# Patient Record
Sex: Male | Born: 1995 | Race: Black or African American | Hispanic: No | Marital: Single | Smoking: Current every day smoker
Health system: Southern US, Community
[De-identification: ages and names within clinical notes are randomized; demographics above are authoritative.]

## PROBLEM LIST (undated history)

## (undated) HISTORY — PX: TONSILLECTOMY AND ADENOIDECTOMY: SUR1326

---
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
## 2015-09-18 ENCOUNTER — Emergency Department: Payer: Self-pay

## 2015-09-18 ENCOUNTER — Encounter: Payer: Self-pay | Admitting: Emergency Medicine

## 2015-09-18 ENCOUNTER — Emergency Department
Admission: EM | Admit: 2015-09-18 | Discharge: 2015-09-18 | Disposition: A | Discharge status: Home / Self Care | Payer: Self-pay | Attending: Emergency Medicine | Admitting: Emergency Medicine

## 2015-09-18 DIAGNOSIS — F17210 Nicotine dependence, cigarettes, uncomplicated: Secondary | ICD-10-CM | POA: Insufficient documentation

## 2015-09-18 DIAGNOSIS — Y999 Unspecified external cause status: Secondary | ICD-10-CM | POA: Insufficient documentation

## 2015-09-18 DIAGNOSIS — S71102A Unspecified open wound, left thigh, initial encounter: Secondary | ICD-10-CM | POA: Insufficient documentation

## 2015-09-18 DIAGNOSIS — Y92810 Car as the place of occurrence of the external cause: Secondary | ICD-10-CM | POA: Insufficient documentation

## 2015-09-18 DIAGNOSIS — W3400XA Accidental discharge from unspecified firearms or gun, initial encounter: Secondary | ICD-10-CM

## 2015-09-18 DIAGNOSIS — X58XXXA Exposure to other specified factors, initial encounter: Secondary | ICD-10-CM | POA: Insufficient documentation

## 2015-09-18 DIAGNOSIS — Y939 Activity, unspecified: Secondary | ICD-10-CM | POA: Insufficient documentation

## 2015-09-18 LAB — CBC
HCT: 38.8 % — ABNORMAL LOW (ref 40.0–52.0)
HEMOGLOBIN: 13.1 g/dL (ref 13.0–18.0)
MCH: 30.5 pg (ref 26.0–34.0)
MCHC: 33.6 g/dL (ref 32.0–36.0)
MCV: 90.6 fL (ref 80.0–100.0)
Platelets: 186 10*3/uL (ref 150–440)
RBC: 4.29 MIL/uL — AB (ref 4.40–5.90)
RDW: 13.5 % (ref 11.5–14.5)
WBC: 11.6 10*3/uL — ABNORMAL HIGH (ref 3.8–10.6)

## 2015-09-18 LAB — COMPREHENSIVE METABOLIC PANEL
ALBUMIN: 5.3 g/dL — AB (ref 3.5–5.0)
ALK PHOS: 70 U/L (ref 38–126)
ALT: 12 U/L — AB (ref 17–63)
AST: 35 U/L (ref 15–41)
Anion gap: 11 (ref 5–15)
BILIRUBIN TOTAL: 0.8 mg/dL (ref 0.3–1.2)
BUN: 9 mg/dL (ref 6–20)
CO2: 26 mmol/L (ref 22–32)
CREATININE: 1.01 mg/dL (ref 0.61–1.24)
Calcium: 9.1 mg/dL (ref 8.9–10.3)
Chloride: 99 mmol/L — ABNORMAL LOW (ref 101–111)
GFR calc Af Amer: >60 mL/min (ref >60)
GLUCOSE: 119 mg/dL — AB (ref 65–99)
POTASSIUM: 2.6 mmol/L — AB (ref 3.5–5.1)
Sodium: 136 mmol/L (ref 135–145)
TOTAL PROTEIN: 8 g/dL (ref 6.5–8.1)

## 2015-09-18 LAB — TYPE AND SCREEN
ABO/RH(D): O POS
Antibody Screen: NEGATIVE

## 2015-09-18 LAB — ABO/RH: ABO/RH(D): O POS

## 2015-09-18 MED ORDER — ONDANSETRON HCL 4 MG/2ML IJ SOLN
4.0000 mg | Freq: Once | INTRAMUSCULAR | Status: AC
  Administered 2015-09-18: 4 mg via INTRAVENOUS

## 2015-09-18 MED ORDER — MORPHINE SULFATE (PF) 4 MG/ML IV SOLN
6.0000 mg | Freq: Once | INTRAVENOUS | Status: AC
  Administered 2015-09-18: 6 mg via INTRAVENOUS

## 2015-09-18 MED ORDER — SODIUM CHLORIDE 0.9 % IV BOLUS (SEPSIS)
1000.0000 mL | Freq: Once | INTRAVENOUS | Status: AC
Start: 2015-09-18 — End: 2015-09-18
  Administered 2015-09-18: 1000 mL via INTRAVENOUS

## 2015-09-18 MED ORDER — HYDROCODONE-ACETAMINOPHEN 5-325 MG PO TABS: 1.0000 | ORAL_TABLET | ORAL | Status: DC | PRN

## 2015-09-18 MED ORDER — IOPAMIDOL (ISOVUE-370) INJECTION 76%
125.0000 mL | Freq: Once | INTRAVENOUS | Status: AC | PRN
  Administered 2015-09-18: 125 mL via INTRAVENOUS

## 2015-09-18 MED ORDER — CEPHALEXIN 500 MG PO CAPS: 500.0000 mg | ORAL_CAPSULE | Freq: Two times a day (BID) | ORAL | Status: AC

## 2015-09-18 MED ORDER — NAPROXEN 500 MG PO TABS: 500.0000 mg | ORAL_TABLET | Freq: Two times a day (BID) | ORAL | Status: AC

## 2015-09-18 MED ORDER — CEFAZOLIN SODIUM 1-5 GM-% IV SOLN
INTRAVENOUS | Status: AC
  Filled 2015-09-18: qty 50

## 2015-09-18 MED ORDER — CEFAZOLIN SODIUM 1-5 GM-% IV SOLN
1.0000 g | Freq: Once | INTRAVENOUS | Status: AC
  Administered 2015-09-18: 1 g via INTRAVENOUS
  Filled 2015-09-18: qty 50
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
## 2015-09-18 NOTE — ED Notes (Signed)
Dr. Cyril LoosenKinner informed of critical potassium of 2.6

## 2015-09-24 ENCOUNTER — Telehealth: Payer: Self-pay | Admitting: Surgery

## 2015-09-24 NOTE — Telephone Encounter (Signed)
Received call from patient's mother. I moved patient's appointment up per her request.

## 2015-09-24 NOTE — Telephone Encounter (Signed)
Patients mother called and would like for Garritt to be seen before May 25th. He is in pain and out of one of the pain medications. She didn't know the name of it but said she would have it by the phone when the nurse calls her back. He is up walking around but the swelling is still present. No fever that she knows of.

## 2015-09-24 NOTE — Telephone Encounter (Signed)
Returned phone call at this time. No answer. Left voicemail for return phone call.

## 2015-09-29 ENCOUNTER — Encounter: Payer: Self-pay | Admitting: Surgery

## 2015-09-29 ENCOUNTER — Ambulatory Visit (INDEPENDENT_AMBULATORY_CARE_PROVIDER_SITE_OTHER): Payer: Self-pay | Admitting: Surgery

## 2015-09-29 ENCOUNTER — Encounter: Payer: Self-pay | Admitting: Emergency Medicine

## 2015-09-29 VITALS — BP 127/72 | HR 68 | Temp 98.1°F | Ht 68.0 in | Wt 149.0 lb

## 2015-09-29 DIAGNOSIS — Y929 Unspecified place or not applicable: Secondary | ICD-10-CM | POA: Insufficient documentation

## 2015-09-29 DIAGNOSIS — Y999 Unspecified external cause status: Secondary | ICD-10-CM | POA: Insufficient documentation

## 2015-09-29 DIAGNOSIS — Y939 Activity, unspecified: Secondary | ICD-10-CM | POA: Insufficient documentation

## 2015-09-29 DIAGNOSIS — W3400XA Accidental discharge from unspecified firearms or gun, initial encounter: Secondary | ICD-10-CM

## 2015-09-29 DIAGNOSIS — S71102A Unspecified open wound, left thigh, initial encounter: Secondary | ICD-10-CM | POA: Insufficient documentation

## 2015-09-29 DIAGNOSIS — F17210 Nicotine dependence, cigarettes, uncomplicated: Secondary | ICD-10-CM | POA: Insufficient documentation

## 2015-09-29 DIAGNOSIS — W3400XD Accidental discharge from unspecified firearms or gun, subsequent encounter: Secondary | ICD-10-CM | POA: Insufficient documentation

## 2015-09-29 DIAGNOSIS — X58XXXA Exposure to other specified factors, initial encounter: Secondary | ICD-10-CM | POA: Insufficient documentation

## 2015-09-29 DIAGNOSIS — F1290 Cannabis use, unspecified, uncomplicated: Secondary | ICD-10-CM | POA: Insufficient documentation

## 2015-09-29 LAB — CBC
HEMATOCRIT: 37.6 % — AB (ref 40.0–52.0)
HEMOGLOBIN: 12.5 g/dL — AB (ref 13.0–18.0)
MCH: 30.2 pg (ref 26.0–34.0)
MCHC: 33.4 g/dL (ref 32.0–36.0)
MCV: 90.5 fL (ref 80.0–100.0)
Platelets: 211 10*3/uL (ref 150–440)
RBC: 4.15 MIL/uL — ABNORMAL LOW (ref 4.40–5.90)
RDW: 13.3 % (ref 11.5–14.5)
WBC: 11.9 10*3/uL — AB (ref 3.8–10.6)
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
## 2015-09-30 ENCOUNTER — Ambulatory Visit: Payer: Self-pay | Admitting: Surgery

## 2015-09-30 ENCOUNTER — Emergency Department
Admission: EM | Admit: 2015-09-30 | Discharge: 2015-09-30 | Disposition: A | Discharge status: Home / Self Care | Payer: Self-pay | Attending: Emergency Medicine | Admitting: Emergency Medicine

## 2015-09-30 DIAGNOSIS — T148XXA Other injury of unspecified body region, initial encounter: Secondary | ICD-10-CM

## 2015-09-30 DIAGNOSIS — W3400XA Accidental discharge from unspecified firearms or gun, initial encounter: Secondary | ICD-10-CM

## 2015-09-30 DIAGNOSIS — L089 Local infection of the skin and subcutaneous tissue, unspecified: Secondary | ICD-10-CM | POA: Insufficient documentation

## 2015-09-30 MED ORDER — PENTAFLUOROPROP-TETRAFLUOROETH EX AERO
INHALATION_SPRAY | CUTANEOUS | Status: AC
  Administered 2015-09-30: 30
  Filled 2015-09-30: qty 30

## 2015-09-30 MED ORDER — SULFAMETHOXAZOLE-TRIMETHOPRIM 800-160 MG PO TABS
1.0000 | ORAL_TABLET | Freq: Two times a day (BID) | ORAL | Status: AC
Start: 2015-09-30 — End: 2015-10-06

## 2015-09-30 MED ORDER — LIDOCAINE-EPINEPHRINE (PF) 1 %-1:200000 IJ SOLN
INTRAMUSCULAR | Status: AC
  Administered 2015-09-30: 30 mL
  Filled 2015-09-30: qty 30

## 2015-09-30 MED ORDER — SULFAMETHOXAZOLE-TRIMETHOPRIM 800-160 MG PO TABS
1.0000 | ORAL_TABLET | Freq: Once | ORAL | Status: AC
  Administered 2015-09-30: 1 via ORAL
  Filled 2015-09-30: qty 1
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
## 2015-09-30 NOTE — ED Notes (Signed)
MD at bedside. 
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
## 2015-10-08 ENCOUNTER — Telehealth: Payer: Self-pay | Admitting: Surgery

## 2015-10-08 NOTE — Telephone Encounter (Signed)
Left message for patient to call.

## 2015-10-08 NOTE — Telephone Encounter (Signed)
Patients mother was requesting a work note for today as patient continues to stay out of work due to leg pain. We informed patients mother that patient has been released to return to work and that movement of this extremity will aide in pain relief. Patient's mother verbalized understanding but was upset with this.

## 2015-10-08 NOTE — Telephone Encounter (Signed)
Patient was suppose to return to work today but still in a lot of pain. Still has a few antibiotics left. Taking tylenol 1 tablet 1 - 2 times a day. No redness or fever. Patient would like to have a few more days off work so he is asking for a extended work note since he is still having pain. Please call
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
## 2020-01-18 ENCOUNTER — Emergency Department
Admission: EM | Admit: 2020-01-18 | Discharge: 2020-01-18 | Disposition: A | Discharge status: Home / Self Care | Payer: Self-pay | Attending: Emergency Medicine | Admitting: Emergency Medicine

## 2020-01-18 ENCOUNTER — Other Ambulatory Visit: Payer: Self-pay

## 2020-01-18 DIAGNOSIS — Z79899 Other long term (current) drug therapy: Secondary | ICD-10-CM | POA: Insufficient documentation

## 2020-01-18 DIAGNOSIS — F17210 Nicotine dependence, cigarettes, uncomplicated: Secondary | ICD-10-CM | POA: Insufficient documentation

## 2020-01-18 DIAGNOSIS — B029 Zoster without complications: Secondary | ICD-10-CM | POA: Insufficient documentation

## 2020-01-18 MED ORDER — LIDOCAINE HCL 2 % EX GEL: 1.0000 "application " | CUTANEOUS | 2 refills | Status: AC | PRN

## 2020-01-18 MED ORDER — HYDROCODONE-ACETAMINOPHEN 5-325 MG PO TABS: 1.0000 | ORAL_TABLET | Freq: Four times a day (QID) | ORAL | 0 refills | Status: AC | PRN

## 2020-01-18 MED ORDER — VALACYCLOVIR HCL 1 G PO TABS: 1000.0000 mg | ORAL_TABLET | Freq: Three times a day (TID) | ORAL | 0 refills | Status: AC

## 2020-01-18 NOTE — ED Triage Notes (Signed)
Patient arrived via POV with friend. Patient is AOx4 and ambulatory. Patient chief complaint is rash on right flank and right side of abdomen. Rash appeared 3-4 days ago and presented as several bumps and has spread and increased in pain. Patient states he can no sleep due to pain. Rash is not itching just painful and appears to look like small blisters.
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
